# Patient Record
Sex: Male | Born: 1990 | Hispanic: Yes | Marital: Married | Smoking: Never smoker
Health system: Southern US, Community
[De-identification: ages and names within clinical notes are randomized; demographics above are authoritative.]

---

## 2015-02-27 ENCOUNTER — Encounter (HOSPITAL_COMMUNITY): Payer: Self-pay | Admitting: Emergency Medicine

## 2015-02-27 ENCOUNTER — Emergency Department (HOSPITAL_COMMUNITY)
Admission: EM | Admit: 2015-02-27 | Discharge: 2015-02-27 | Discharge status: Against Medical Advice | Payer: Self-pay | Attending: Emergency Medicine | Admitting: Emergency Medicine

## 2015-02-27 DIAGNOSIS — N644 Mastodynia: Secondary | ICD-10-CM

## 2015-02-27 DIAGNOSIS — N62 Hypertrophy of breast: Secondary | ICD-10-CM | POA: Insufficient documentation

## 2015-02-27 NOTE — ED Notes (Signed)
Pt states he is going to leave and "go to the clinic".

## 2015-02-27 NOTE — ED Provider Notes (Signed)
CSN: [id number]     Arrival date & time 02/27/15  1137 History  This chart was scribed for non-physician practitioner, Roxy Horseman, PA-C, working with Rolland Porter, MD by Charline Bills, ED Scribe. This patient was seen in room TR07C/TR07C and the patient's care was started at 12:17 PM.   Chief Complaint  Patient presents with  . Breast Problem  . Breast Pain   The history is provided by the patient. The history is limited by a language barrier. A language interpreter was used.   HPI Comments: Stuart Ortiz is a 24 y.o. male who presents to the Emergency Department with a chief complaint of persistent right nipple pain for the past few months, worsened over the past few days. Pt reports constant, moderate pain that is exacerbated with palpation. He further reports associated swelling to right nipple but denies drainage from the area. No known injury. He also denies fever and redness. No family h/o breast CA. No medical allergy.   History reviewed. No pertinent past medical history. No past surgical history on file. No family history on file. History  Substance Use Topics  . Smoking status: Never Smoker   . Smokeless tobacco: Not on file  . Alcohol Use: No    Review of Systems  Constitutional: Negative for fever.  Skin: Negative for color change.       + R breast mass   Allergies  Review of patient's allergies indicates not on file.  Home Medications   Prior to Admission medications   Not on File   BP 116/71 mmHg  Pulse 61  Temp(Src) 98.7 F (37.1 C) (Oral)  Resp 16  Ht 5\' 6"  (1.676 m)  Wt 156 lb (70.761 kg)  BMI 25.19 kg/m2  SpO2 100% Physical Exam  Constitutional: He is oriented to person, place, and time. He appears well-developed and well-nourished. No distress.  HENT:  Head: Normocephalic and atraumatic.  Eyes: Conjunctivae and EOM are normal.  Neck: Neck supple. No tracheal deviation present.  Cardiovascular: Normal rate.   Pulmonary/Chest: Effort normal.  No respiratory distress.  R sided gynecomastia. Tenderness to palpation. Tissue is soft. No nodules.  Musculoskeletal: Normal range of motion.  Neurological: He is alert and oriented to person, place, and time.  Skin: Skin is warm and dry. No erythema.  No cellulitis. No sign of abscess.   Psychiatric: He has a normal mood and affect. His behavior is normal.  Nursing note and vitals reviewed.  ED Course  Procedures (including critical care time) DIAGNOSTIC STUDIES: Oxygen Saturation is 100% on RA, normal by my interpretation.    COORDINATION OF CARE: 12:20 PM-Discussed treatment plan which includes CXR with pt at bedside and pt agreed to plan.   Labs Review Labs Reviewed - No data to display  Imaging Review No results found.   EKG Interpretation None      MDM   Final diagnoses:  Gynecomastia, male  Breast tenderness in male    Patient was promptly seen and evaluated with a Spanish language interpreter. Patient has been having swelling to his right breast and tenderness to his right nipple the past 1-2 months. Will check chest x-ray, and will recommend outpatient follow-up and ultrasound.  12:52 PM Informed that the patient left AMA prior to getting chest x-ray and pain medication. Patient stated that he will follow-up in the clinic. Patient signed out AGAINST MEDICAL ADVICE. Patient had capacity to make decisions for himself. He was urged by staff to stay and complete his workup.  I personally performed the services described in this documentation, which was scribed in my presence. The recorded information has been reviewed and is accurate.      Roxy Horseman, PA-C 02/27/15 1418  Rolland Porter, MD 02/28/15 1001

## 2015-02-27 NOTE — ED Notes (Signed)
Translator / wife stated, he's had right breast has been swelling for 5 weeks and painful

## 2015-02-27 NOTE — ED Notes (Signed)
Right nipple swollen and tender. No known injury. Onset 5 weeks ago. No drainage noted. States he "passed out twice yesterday due to pain".

## 2015-08-19 ENCOUNTER — Emergency Department (HOSPITAL_BASED_OUTPATIENT_CLINIC_OR_DEPARTMENT_OTHER)
Admission: EM | Admit: 2015-08-19 | Discharge: 2015-08-19 | Disposition: A | Discharge status: Home / Self Care | Payer: Self-pay | Attending: Emergency Medicine | Admitting: Emergency Medicine

## 2015-08-19 ENCOUNTER — Encounter (HOSPITAL_BASED_OUTPATIENT_CLINIC_OR_DEPARTMENT_OTHER): Payer: Self-pay

## 2015-08-19 DIAGNOSIS — J069 Acute upper respiratory infection, unspecified: Secondary | ICD-10-CM | POA: Insufficient documentation

## 2015-08-19 MED ORDER — BENZONATATE 100 MG PO CAPS
100.0000 mg | ORAL_CAPSULE | Freq: Once | ORAL | Status: AC
  Administered 2015-08-19: 100 mg via ORAL
  Filled 2015-08-19: qty 1

## 2015-08-19 MED ORDER — GUAIFENESIN ER 600 MG PO TB12: 600.0000 mg | ORAL_TABLET | Freq: Two times a day (BID) | ORAL | Status: AC

## 2015-08-19 MED ORDER — BENZONATATE 100 MG PO CAPS: 100.0000 mg | ORAL_CAPSULE | Freq: Three times a day (TID) | ORAL | Status: AC

## 2015-08-19 MED ORDER — IBUPROFEN 800 MG PO TABS: 800.0000 mg | ORAL_TABLET | Freq: Three times a day (TID) | ORAL | Status: AC

## 2015-08-19 MED ORDER — IBUPROFEN 800 MG PO TABS
800.0000 mg | ORAL_TABLET | Freq: Once | ORAL | Status: AC
  Administered 2015-08-19: 800 mg via ORAL
  Filled 2015-08-19: qty 1

## 2015-08-19 NOTE — ED Notes (Signed)
MD James at bedside.  

## 2015-08-19 NOTE — ED Notes (Signed)
C/o HA, cough, sore throat x 3 days-NAD-step daughter interpreter

## 2015-08-19 NOTE — Discharge Instructions (Signed)
Infección del tracto respiratorio superior, adultos  (Upper Respiratory Infection, Adult)  La mayoría de las infecciones del tracto respiratorio superior son infecciones virales de las vías que llevan el aire a los pulmones. Un infección del tracto respiratorio superior afecta la nariz, la garganta y las vías respiratorias superiores. El tipo más frecuente de infección del tracto respiratorio superior es la nasofaringitis, que habitualmente se conoce como "resfrío común".  Las infecciones del tracto respiratorio superior siguen su curso y por lo general se curan solas. En la mayoría de los casos, la infección del tracto respiratorio superior no requiere atención médica, pero a veces, después de una infección viral, puede surgir una infección bacteriana en las vías respiratorias superiores. Esto se conoce como infección secundaria. Las infecciones sinusales y en el oído medio son tipos frecuentes de infecciones secundarias en el tracto respiratorio superior.  La neumonía bacteriana también puede complicar un cuadro de infección del tracto respiratorio superior. Este tipo de infección puede empeorar el asma y la enfermedad pulmonar obstructiva crónica (EPOC). En algunos casos, estas complicaciones pueden requerir atención médica de emergencia y poner en peligro la vida.   CAUSAS  Casi todas las infecciones del tracto respiratorio superior se deben a los virus. Un virus es un tipo de microbio que puede contagiarse de una persona a otra.   FACTORES DE RIESGO  Puede estar en riesgo de sufrir una infección del tracto respiratorio superior si:   · Fuma.  · Tiene una enfermedad pulmonar o cardíaca crónica.  · Tiene debilitado el sistema de defensa (inmunitario) del cuerpo.  · Es muy joven o de edad muy avanzada.  · Tiene asma o alergias nasales.  · Trabaja en áreas donde hay mucha gente o poca ventilación.  · Trabaja en una escuela o en un centro de atención médica.  SIGNOS Y SÍNTOMAS   Habitualmente, los síntomas aparecen  de 2 a 3 días después de entrar en contacto con el virus del resfrío. La mayoría de las infecciones virales en el tracto respiratorio superior duran de 7 a 10 días. Sin embargo, las infecciones virales en el tracto respiratorio superior a causa del virus de la gripe pueden durar de 14 a 18 días y, habitualmente, son más graves. Entre los síntomas se pueden incluir los siguientes:   · Secreción o congestión nasal.  · Estornudos.  · Tos.  · Dolor de garganta.  · Dolor de cabeza.  · Fatiga.  · Fiebre.  · Pérdida del apetito.  · Dolor en la frente, detrás de los ojos y por encima de los pómulos (dolor sinusal).  · Dolores musculares.  DIAGNÓSTICO   El médico puede diagnosticar una infección del tracto respiratorio superior mediante los siguientes estudios:  · Examen físico.  · Pruebas para verificar si los síntomas no se deben a otra afección, por ejemplo:    Faringitis estreptocócica.    Sinusitis.    Neumonía.    Asma.  TRATAMIENTO   Esta infección desaparece sola, con el tiempo. No puede curarse con medicamentos, pero a menudo se prescriben para aliviar los síntomas. Los medicamentos pueden ser útiles para lo siguiente:  · Bajar la fiebre.  · Reducir la tos.  · Aliviar la congestión nasal.  INSTRUCCIONES PARA EL CUIDADO EN EL HOGAR   · Tome los medicamentos solamente como se lo haya indicado el médico.  · A fin de aliviar el dolor de garganta, haga gárgaras con solución salina templada o consuma caramelos para la tos, como se lo haya indicado el médico.  · Use un humidificador   de vapor cálido o inhale el vapor de la ducha para aumentar la humedad del aire. Esto facilitará la respiración.  · Beba suficiente líquido para mantener la orina clara o de color amarillo pálido.  · Consuma sopas y otros caldos transparentes, y aliméntese bien.  · Descanse todo lo que sea necesario.  · Regrese al trabajo cuando la temperatura se le haya normalizado o cuando el médico lo autorice. Es posible que deba quedarse en su casa durante  un tiempo prolongado, para no infectar a los demás. También puede usar un barbijo y lavarse las manos con cuidado para evitar la propagación del virus.  · Aumente el uso del inhalador si tiene asma.  · No consuma ningún producto que contenga tabaco, lo que incluye cigarrillos, tabaco de mascar o cigarrillos electrónicos. Si necesita ayuda para dejar de fumar, consulte al médico.  PREVENCIÓN   La mejor manera de protegerse de un resfrío es mantener una higiene adecuada.   · Evite el contacto oral o físico con personas que tengan síntomas de resfrío.  · En caso de contacto, lávese las manos con frecuencia.  No hay pruebas claras de que la vitamina C, la vitamina E, la equinácea o el ejercicio reduzcan la probabilidad de contraer un resfrío. Sin embargo, siempre se recomienda descansar mucho, hacer ejercicio y alimentarse bien.   SOLICITE ATENCIÓN MÉDICA SI:   · Su estado empeora en lugar de mejorar.  · Los medicamentos no logran controlar los síntomas.  · Tiene escalofríos.  · La sensación de falta de aire empeora.  · Tiene mucosidad marrón o roja.  · Tiene secreción nasal amarilla o marrón.  · Le duele la cara, especialmente al inclinarse hacia adelante.  · Tiene fiebre.  · Tiene los ganglios del cuello hinchados.  · Siente dolor al tragar.  · Tiene zonas blancas en la parte de atrás de la garganta.  SOLICITE ATENCIÓN MÉDICA DE INMEDIATO SI:   · Tiene síntomas intensos o persistentes de:    Dolor de cabeza.    Dolor de oídos.    Dolor sinusal.    Dolor en el pecho.  · Tiene enfermedad pulmonar crónica y cualquiera de estos síntomas:    Sibilancias.    Tos prolongada.    Tos con sangre.    Cambio en la mucosidad habitual.  · Presenta rigidez en el cuello.  · Tiene cambios en:    La visión.    La audición.    El pensamiento.    El estado de ánimo.  ASEGÚRESE DE QUE:   · Comprende estas instrucciones.  · Controlará su afección.  · Recibirá ayuda de inmediato si no mejora o si empeora.     Esta información no tiene como  fin reemplazar el consejo del médico. Asegúrese de hacerle al médico cualquier pregunta que tenga.     Document Released: 04/27/2005 Document Revised: 12/02/2014  Elsevier Interactive Patient Education ©2016 [employer]

## 2015-08-25 NOTE — ED Provider Notes (Signed)
CSN: [id number]     Arrival date & time 08/19/15  1137 History   First MD Initiated Contact with Patient 08/19/15 1242     Chief Complaint  Patient presents with  . URI      HPI  Patient presents for evaluation of a cough and runny noses bodyaches over last 2 days. No chest pain or shortness of breath no GI complaints. Is here with his wife with similar complaints. He was not denies for influenza. No skin rash noted joint pain or other complaints.  History reviewed. No pertinent past medical history. History reviewed. No pertinent past surgical history. No family history on file. Social History  Substance Use Topics  . Smoking status: Never Smoker   . Smokeless tobacco: None  . Alcohol Use: No    Review of Systems  Constitutional: Negative for fever, chills, diaphoresis, appetite change and fatigue.  HENT: Positive for congestion. Negative for mouth sores, sore throat and trouble swallowing.   Eyes: Negative for visual disturbance.  Respiratory: Positive for cough. Negative for chest tightness, shortness of breath and wheezing.   Cardiovascular: Negative for chest pain.  Gastrointestinal: Negative for nausea, vomiting, abdominal pain, diarrhea and abdominal distention.  Endocrine: Negative for polydipsia, polyphagia and polyuria.  Genitourinary: Negative for dysuria, frequency and hematuria.  Musculoskeletal: Negative for gait problem.  Skin: Negative for color change, pallor and rash.  Neurological: Negative for dizziness, syncope, light-headedness and headaches.  Hematological: Does not bruise/bleed easily.  Psychiatric/Behavioral: Negative for behavioral problems and confusion.      Allergies  Review of patient's allergies indicates no known allergies.  Home Medications   Prior to Admission medications   Medication Sig Start Date End Date Taking? Authorizing Provider  benzonatate (TESSALON) 100 MG capsule Take 1 capsule (100 mg total) by mouth every 8 (eight) hours.  08/19/15   Rolland Porter, MD  guaiFENesin (MUCINEX) 600 MG 12 hr tablet Take 1 tablet (600 mg total) by mouth 2 (two) times daily. 08/19/15   Rolland Porter, MD  ibuprofen (ADVIL,MOTRIN) 800 MG tablet Take 1 tablet (800 mg total) by mouth 3 (three) times daily. 08/19/15   Rolland Porter, MD   BP 124/80 mmHg  Pulse 70  Temp(Src) 98.2 F (36.8 C) (Oral)  Resp 16  SpO2 100% Physical Exam  Constitutional: He is oriented to person, place, and time. He appears well-developed and well-nourished. No distress.  HENT:  Head: Normocephalic.  Eyes: Conjunctivae are normal. Pupils are equal, round, and reactive to light. No scleral icterus.  Neck: Normal range of motion. Neck supple. No thyromegaly present.  Cardiovascular: Normal rate and regular rhythm.  Exam reveals no gallop and no friction rub.   No murmur heard. Pulmonary/Chest: Effort normal and breath sounds normal. No respiratory distress. He has no wheezes. He has no rales.  Abdominal: Soft. Bowel sounds are normal. He exhibits no distension. There is no tenderness. There is no rebound.  Musculoskeletal: Normal range of motion.  Neurological: He is alert and oriented to person, place, and time.  Skin: Skin is warm and dry. No rash noted.  Psychiatric: He has a normal mood and affect. His behavior is normal.    ED Course  Procedures (including critical care time) Labs Review Labs Reviewed - No data to display  Imaging Review No results found. I have personally reviewed and evaluated these images and lab results as part of my medical decision-making.   EKG Interpretation None      MDM   Final diagnoses:  URI (upper respiratory infection)    No specific or localizing symptoms or findings to suggest suffered for bacterial infection. Likely simple viral syndrome. Plan is expectant management symptomatically treatment.    Rolland Porter, MD 08/25/15 [phone]

## 2015-10-27 ENCOUNTER — Emergency Department (HOSPITAL_BASED_OUTPATIENT_CLINIC_OR_DEPARTMENT_OTHER)
Admission: EM | Admit: 2015-10-27 | Discharge: 2015-10-27 | Disposition: A | Discharge status: Home / Self Care | Payer: Self-pay | Attending: Emergency Medicine | Admitting: Emergency Medicine

## 2015-10-27 ENCOUNTER — Encounter (HOSPITAL_BASED_OUTPATIENT_CLINIC_OR_DEPARTMENT_OTHER): Payer: Self-pay | Admitting: *Deleted

## 2015-10-27 DIAGNOSIS — Z4802 Encounter for removal of sutures: Secondary | ICD-10-CM | POA: Insufficient documentation

## 2015-10-27 DIAGNOSIS — Z791 Long term (current) use of non-steroidal anti-inflammatories (NSAID): Secondary | ICD-10-CM | POA: Insufficient documentation

## 2015-10-27 DIAGNOSIS — Z79899 Other long term (current) drug therapy: Secondary | ICD-10-CM | POA: Insufficient documentation

## 2015-10-27 NOTE — Discharge Instructions (Signed)
Remoción de la sutura, cuidados posteriores  (Suture Removal, Care After)  Siga estas instrucciones durante las próximas semanas. Estas indicaciones le proporcionan información general acerca de cómo deberá cuidarse después del procedimiento. El médico también podrá darle instrucciones más específicas. El tratamiento se ha planificado de acuerdo a las prácticas médicas actuales, pero a veces se producen problemas. Comuníquese con el médico si tiene algún problema o tiene dudas después del procedimiento.  QUÉ ESPERAR DESPUÉS DEL PROCEDIMIENTO  Después de que le retiren los puntos (suturas), es normal experimentar lo siguiente:  · Molestias e hinchazón en la zona de la herida.  · Leve enrojecimiento en la zona de la herida.  INSTRUCCIONES PARA EL CUIDADO EN EL HOGAR   · Si tiene bandas adhesivas en la piel sobre la zona de la herida, no las retire. Se caerán solas en unos pocos días. Si las bandas adhesivas siguen en su lugar después de 14 días, entonces puede retirarlas.  · Cambie los apósitos (vendajes), al menos, una vez al día o según las instrucciones del médico. Si el vendaje se adhiere, remójelo con agua jabonosa tibia.  · Solo aplique un ungüento o crema según las indicaciones del médico. Si usa crema o ungüento, lave la zona con agua y jabón dos veces por día para quitarlo todo. Enjuague el jabón y seque suavemente la zona con una toalla limpia.  · Mantenga la herida limpia y seca. Si el vendaje se moja, se ensucia o tiene mal olor, cámbielo tan pronto como pueda.  · Continúe protegiendo la herida de lesiones.  · Use protector solar cuando salga al sol. Las cicatrices nuevas se broncean fácilmente.  SOLICITE ATENCIÓN MÉDICA SI:  · Aumenta el enrojecimiento, la hinchazón o el dolor en la zona afectada.  · Observa que sale pus de la herida.  · Tiene fiebre.  · Advierte un olor fétido que proviene de la herida o del vendaje.  · La herida se abre (los bordes se separan).     Esta información no tiene como fin  reemplazar el consejo del médico. Asegúrese de hacerle al médico cualquier pregunta que tenga.     Document Released: 04/27/2005 Document Revised: 05/08/2013  Elsevier Interactive Patient Education ©2016 [employer] Inc[employer]

## 2015-10-27 NOTE — ED Notes (Signed)
Suture removal kit placed at bedside.
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
## 2015-10-27 NOTE — ED Notes (Signed)
Suture removal from scalp.

## 2016-01-10 ENCOUNTER — Encounter (HOSPITAL_COMMUNITY): Payer: Self-pay | Admitting: Family Medicine

## 2016-01-10 ENCOUNTER — Emergency Department (HOSPITAL_COMMUNITY): Payer: Self-pay

## 2016-01-10 ENCOUNTER — Emergency Department (HOSPITAL_COMMUNITY)
Admission: EM | Admit: 2016-01-10 | Discharge: 2016-01-10 | Disposition: A | Discharge status: Home / Self Care | Payer: Self-pay | Attending: Emergency Medicine | Admitting: Emergency Medicine

## 2016-01-10 DIAGNOSIS — M79601 Pain in right arm: Secondary | ICD-10-CM | POA: Insufficient documentation

## 2016-01-10 DIAGNOSIS — Z791 Long term (current) use of non-steroidal anti-inflammatories (NSAID): Secondary | ICD-10-CM | POA: Insufficient documentation

## 2016-01-10 DIAGNOSIS — R51 Headache: Secondary | ICD-10-CM | POA: Insufficient documentation

## 2016-01-10 DIAGNOSIS — N62 Hypertrophy of breast: Secondary | ICD-10-CM | POA: Insufficient documentation

## 2016-01-10 LAB — BASIC METABOLIC PANEL
ANION GAP: 10 (ref 5–15)
BUN: 9 mg/dL (ref 6–20)
CHLORIDE: 105 mmol/L (ref 101–111)
CO2: 22 mmol/L (ref 22–32)
Calcium: 9.6 mg/dL (ref 8.9–10.3)
Creatinine, Ser: 0.89 mg/dL (ref 0.61–1.24)
GFR calc non Af Amer: >60 mL/min (ref >60)
GLUCOSE: 113 mg/dL — AB (ref 65–99)
POTASSIUM: 3.8 mmol/L (ref 3.5–5.1)
Sodium: 137 mmol/L (ref 135–145)

## 2016-01-10 LAB — CBC WITH DIFFERENTIAL/PLATELET
BASOS ABS: 0 10*3/uL (ref 0.0–0.1)
BASOS PCT: 0 %
Eosinophils Absolute: 1.6 10*3/uL — ABNORMAL HIGH (ref 0.0–0.7)
Eosinophils Relative: 22 %
HEMATOCRIT: 44.9 % (ref 39.0–52.0)
HEMOGLOBIN: 15.1 g/dL (ref 13.0–17.0)
LYMPHS PCT: 24 %
Lymphs Abs: 1.7 10*3/uL (ref 0.7–4.0)
MCH: 28.3 pg (ref 26.0–34.0)
MCHC: 33.6 g/dL (ref 30.0–36.0)
MCV: 84.2 fL (ref 78.0–100.0)
MONO ABS: 0.4 10*3/uL (ref 0.1–1.0)
Monocytes Relative: 6 %
NEUTROS ABS: 3.3 10*3/uL (ref 1.7–7.7)
NEUTROS PCT: 48 %
Platelets: 208 10*3/uL (ref 150–400)
RBC: 5.33 MIL/uL (ref 4.22–5.81)
RDW: 13.1 % (ref 11.5–15.5)
WBC: 7 10*3/uL (ref 4.0–10.5)

## 2016-01-10 LAB — I-STAT TROPONIN, ED: Troponin i, poc: 0 ng/mL (ref 0.00–0.08)

## 2016-01-10 MED ORDER — SODIUM CHLORIDE 0.9 % IV BOLUS (SEPSIS)
1000.0000 mL | Freq: Once | INTRAVENOUS | Status: AC
  Administered 2016-01-10: 1000 mL via INTRAVENOUS

## 2016-01-10 MED ORDER — MORPHINE SULFATE (PF) 4 MG/ML IV SOLN
4.0000 mg | INTRAVENOUS | Status: DC | PRN
  Administered 2016-01-10 (×2): 4 mg via INTRAVENOUS
  Filled 2016-01-10 (×2): qty 1

## 2016-01-10 MED ORDER — IOPAMIDOL (ISOVUE-370) INJECTION 76%
INTRAVENOUS | Status: AC
  Administered 2016-01-10: 100 mL
  Filled 2016-01-10: qty 100

## 2016-01-10 MED ORDER — NAPROXEN 500 MG PO TABS: 500.0000 mg | ORAL_TABLET | Freq: Two times a day (BID) | ORAL | Status: AC

## 2016-01-10 NOTE — ED Notes (Signed)
Patient transported to CT 

## 2016-01-10 NOTE — ED Notes (Signed)
Pt here for pain in right side of head radiating into right neck, shoulder and arm. sts he woke up at 5 am this way. sts he feels dizzy.

## 2016-01-10 NOTE — Discharge Instructions (Signed)
Follow up with a primary care doctor for further evaluation.  Take the medications as prescribed.

## 2016-01-10 NOTE — ED Notes (Signed)
Patient transported to X-ray 

## 2016-01-10 NOTE — ED Provider Notes (Addendum)
CSN: [id number]     Arrival date & time 01/10/16  1059 History   First MD Initiated Contact with Patient 01/10/16 1124     Chief Complaint  Patient presents with  . Arm Pain  . Chest Pain  . Headache   HPI Pt woke up this am complaining of pain on the right side of his chest.  The right breast area feels swollen and enlarged.  He has pain also hin his head and his right arm and neck.  The pain is sharp.  This all started around 5 am.  He also feels dizzy and lightheaded.  He is concerned he may die.  No fevers.  No shortness of breath.  No cough.  He denies prior breast swelling.  History reviewed. No pertinent past medical history. History reviewed. No pertinent past surgical history. History reviewed. No pertinent family history. Social History  Substance Use Topics  . Smoking status: Never Smoker   . Smokeless tobacco: None  . Alcohol Use: No    Review of Systems  All other systems reviewed and are negative.     Allergies  Review of patient's allergies indicates no known allergies.  Home Medications   Prior to Admission medications   Medication Sig Start Date End Date Taking? Authorizing Provider  benzonatate (TESSALON) 100 MG capsule Take 1 capsule (100 mg total) by mouth every 8 (eight) hours. 08/19/15   Rolland PorterMark James, MD  guaiFENesin (MUCINEX) 600 MG 12 hr tablet Take 1 tablet (600 mg total) by mouth 2 (two) times daily. 08/19/15   Rolland PorterMark James, MD  ibuprofen (ADVIL,MOTRIN) 800 MG tablet Take 1 tablet (800 mg total) by mouth 3 (three) times daily. 08/19/15   Rolland PorterMark James, MD  naproxen (NAPROSYN) 500 MG tablet Take 1 tablet (500 mg total) by mouth 2 (two) times daily. 01/10/16   Linwood DibblesJon Arieanna Pressey, MD   BP 124/76 mmHg  Pulse 73  Temp(Src) 98.2 F (36.8 C)  Resp 18  SpO2 96% Physical Exam  Constitutional: He appears well-developed and well-nourished. No distress.  HENT:  Head: Normocephalic and atraumatic.  Right Ear: External ear normal.  Left Ear: External ear normal.  Eyes:  Conjunctivae are normal. Right eye exhibits no discharge. Left eye exhibits no discharge. No scleral icterus.  Neck: Neck supple. No tracheal deviation present.  Cardiovascular: Normal rate, regular rhythm and intact distal pulses.   Pulmonary/Chest: Effort normal and breath sounds normal. No stridor. No respiratory distress. He has no wheezes. He has no rales.  ?gynecomastia on right side, no induration, no erythema but right pectoral region is greater than the left  Abdominal: Soft. Bowel sounds are normal. He exhibits no distension. There is no tenderness. There is no rebound and no guarding.  Musculoskeletal: He exhibits no edema or tenderness.  Neurological: He is alert. He has normal strength. No cranial nerve deficit (no facial droop, extraocular movements intact, no slurred speech) or sensory deficit. He exhibits normal muscle tone. He displays no seizure activity. Coordination normal.  Skin: Skin is warm and dry. No rash noted.  Psychiatric: He has a normal mood and affect.  Nursing note and vitals reviewed.   ED Course  Procedures (including critical care time) Labs Review Labs Reviewed  CBC WITH DIFFERENTIAL/PLATELET - Abnormal; Notable for the following:    Eosinophils Absolute 1.6 (*)    All other components within normal limits  BASIC METABOLIC PANEL - Abnormal; Notable for the following:    Glucose, Bld 113 (*)    All other  components within normal limits  Rosezena Sensor, ED    Imaging Review Dg Chest 2 View  01/10/2016  CLINICAL DATA:  Chest pain EXAM: CHEST  2 VIEW COMPARISON:  None. FINDINGS: The heart size and mediastinal contours are within normal limits. Both lungs are clear. The visualized skeletal structures are unremarkable. IMPRESSION: No active cardiopulmonary disease. Electronically Signed   By: Gerome Sam III M.D   On: 01/10/2016 12:12   Ct Angio Chest Pe W/cm &/or Wo Cm  01/10/2016  CLINICAL DATA:  Right-sided chest pain EXAM: CT ANGIOGRAPHY CHEST  WITH CONTRAST TECHNIQUE: Multidetector CT imaging of the chest was performed using the standard protocol during bolus administration of intravenous contrast. Multiplanar CT image reconstructions and MIPs were obtained to evaluate the vascular anatomy. CONTRAST:  100 mL of Isovue 370 COMPARISON:  None. FINDINGS: Central airways are normal. No pneumothorax. No pulmonary nodules, masses, or focal infiltrates. There is gynecomastia on the right. No adenopathy. No effusion. The heart size is normal. No aneurysm or dissection seen on limited views of the thoracic aorta. No pulmonary emboli. Evaluation of the upper abdomen is limited but unremarkable. Visualized bones are normal. Review of the MIP images confirms the above findings. IMPRESSION: No pulmonary emboli or acute abnormalities. Electronically Signed   By: Gerome Sam III M.D   On: 01/10/2016 14:09   I have personally reviewed and evaluated these images and lab results as part of my medical decision-making.   EKG Interpretation   Date/Time:  Sunday January 10 2016 14:02:41 EDT Ventricular Rate:  56 PR Interval:  124 QRS Duration: 110 QT Interval:  417 QTC Calculation: 402 R Axis:   33 Text Interpretation:  Sinus rhythm Normal ECG No old tracing to compare  Confirmed by Alyx Mcguirk  MD-J, Raeanna Soberanes ([id number]) on 01/10/2016 2:06:33 PM      MDM   Final diagnoses:  Gynecomastia, male   Labs are normal.  CT scan shows unilateral gynecomastia which is consistent with his exam.  No erythema of the skin to suggest cellulitis.  No discrete mass or abscess on exam.  Will have him take a course of nsaids.  Follow up with a primary are doctor (referral to [facility]) to see if it improves or consider further evaluation if it persists.      Linwood Dibbles, MD 01/10/16 55  I reviewed old records and patient was seen in July 2016 and was diagnosed with gynecomastia. Could consider outpatient surgical treatment.  No emergency condition present at this time.    Linwood Dibbles, MD 01/10/16 1430

## 2017-04-11 IMAGING — DX DG CHEST 2V
2 series · 2 of 2 positions shown · non-contrast
Comparison: None.

CLINICAL DATA: Chest pain

EXAM:
CHEST  2 VIEW

[w chest pa]
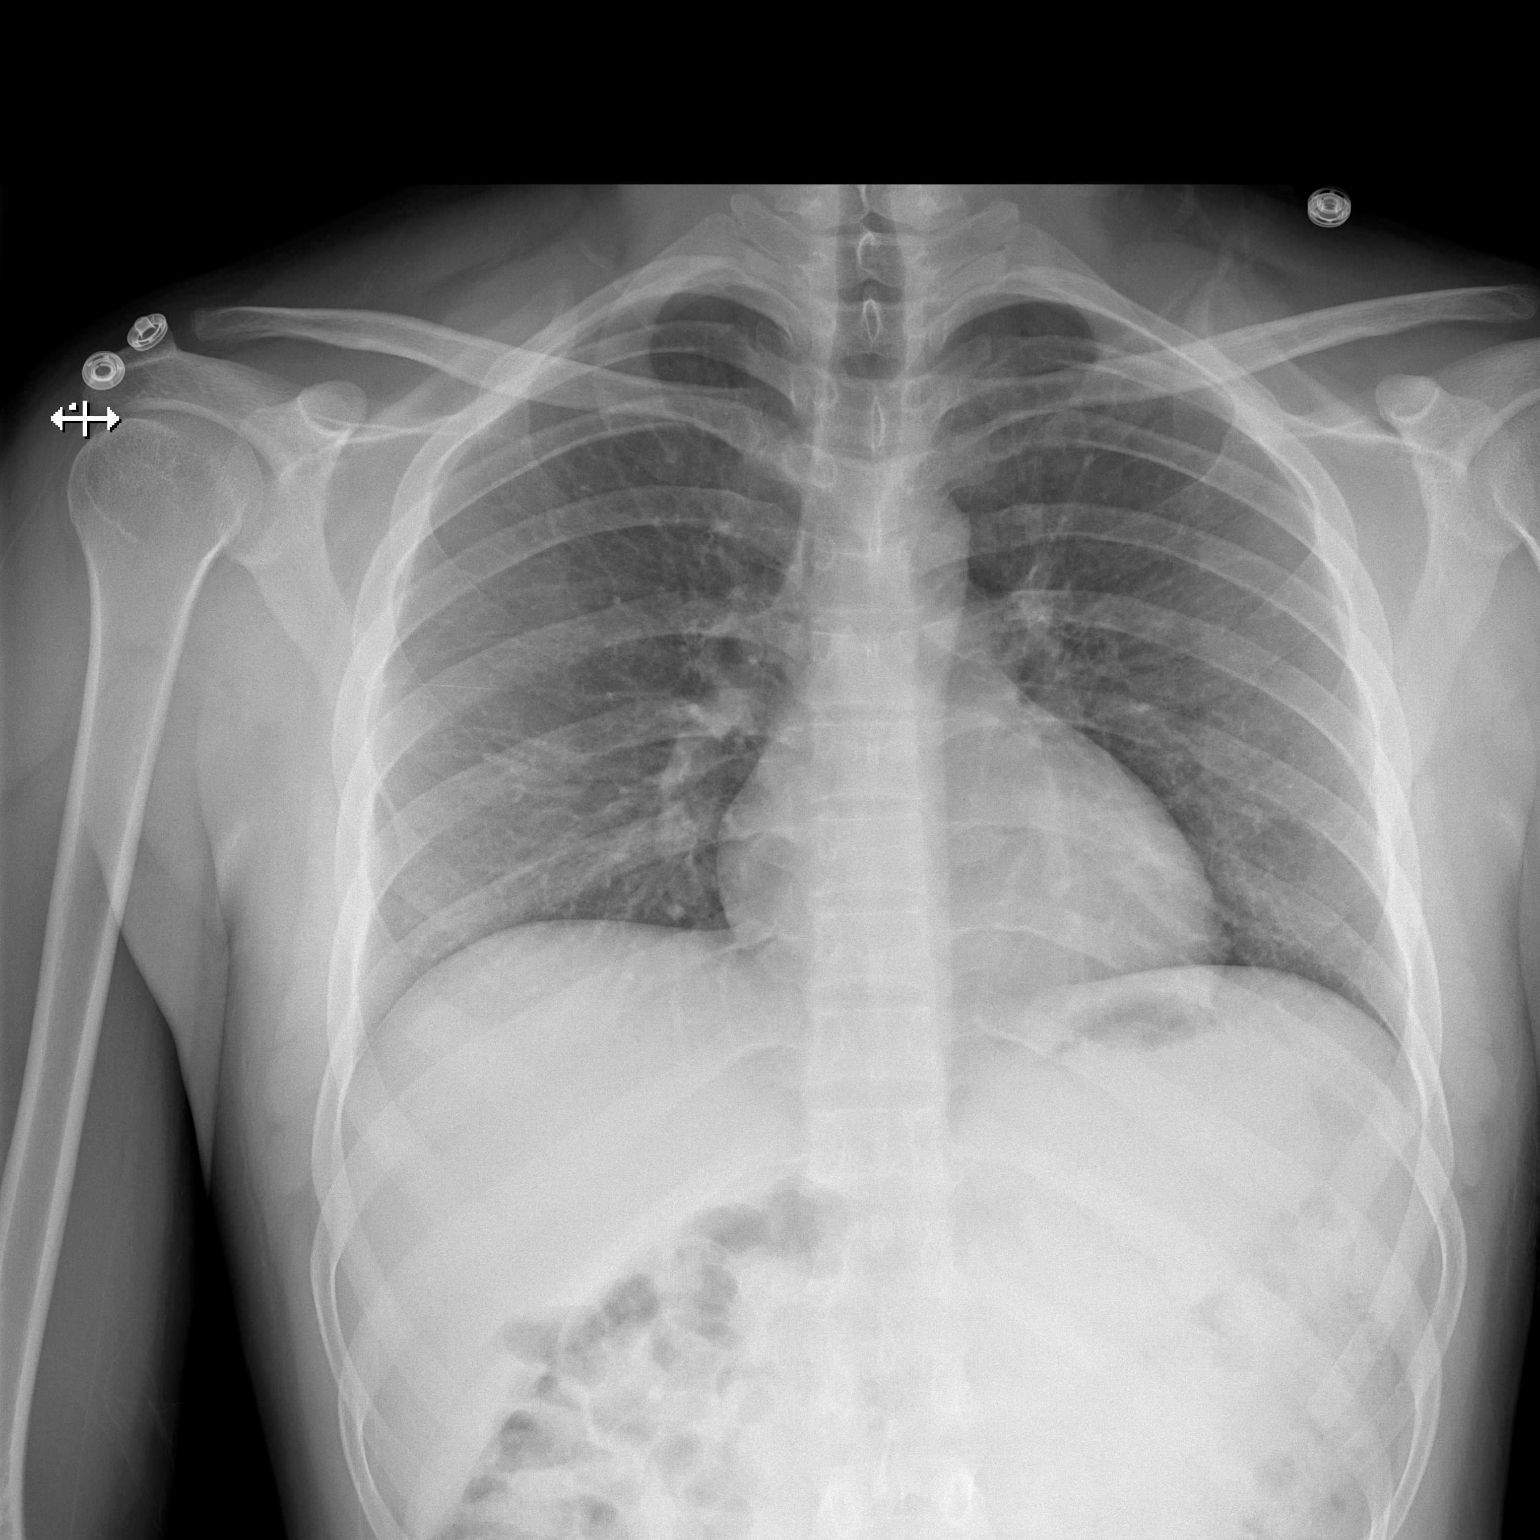

[w chest lat]
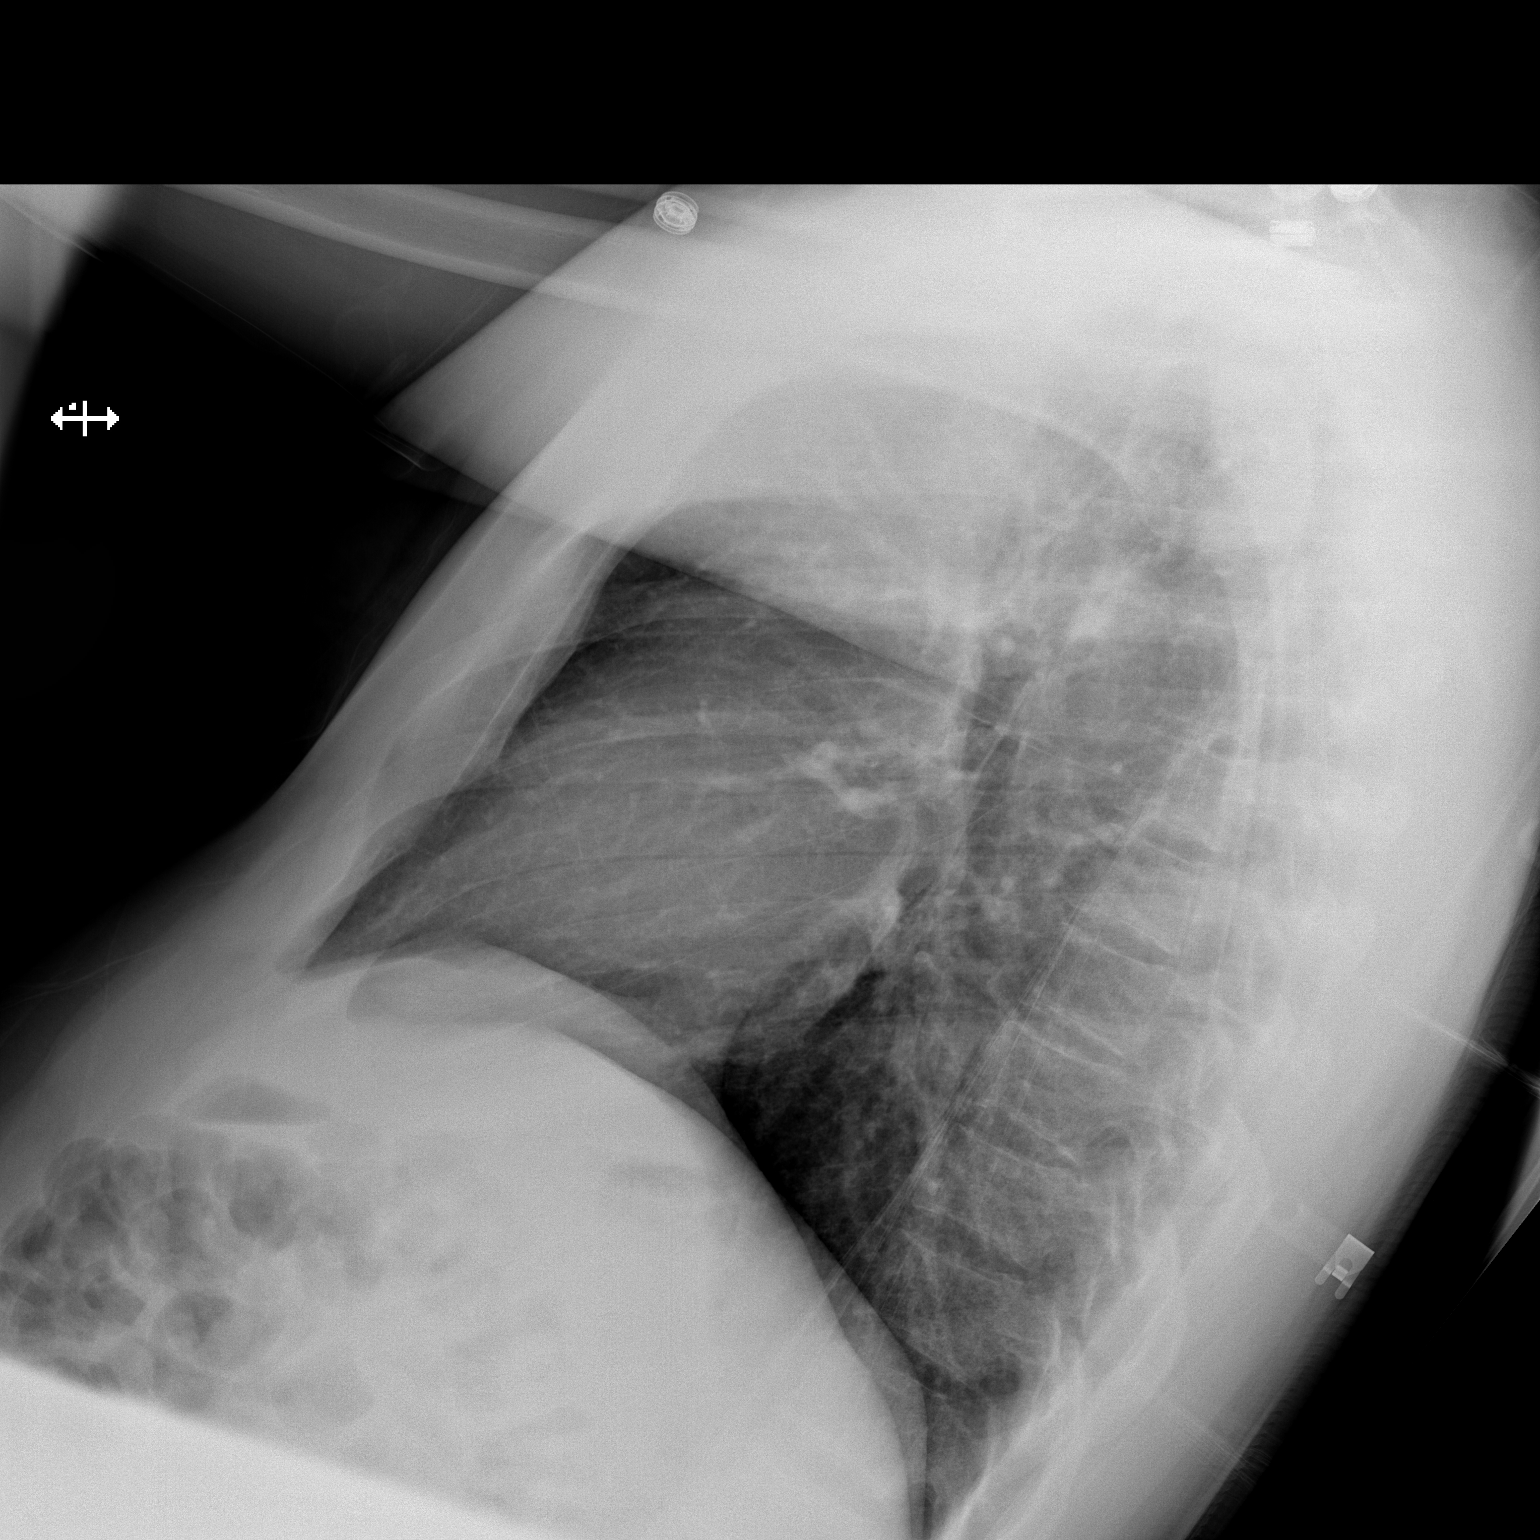

[2 of 2 positions shown; findings below may reference images not displayed]

FINDINGS: The heart size and mediastinal contours are within normal limits.
Both lungs are clear. The visualized skeletal structures are
unremarkable.
IMPRESSION: No active cardiopulmonary disease.

## 2017-04-11 IMAGING — CT CT ANGIO CHEST
1 of 8 series · 17 of 36 positions shown · IV contrast (Iohexol (Omnipaque 350))
Comparison: None.

CLINICAL DATA: Right-sided chest pain

EXAM:
CT ANGIOGRAPHY CHEST WITH CONTRAST
TECHNIQUE: Multidetector CT imaging of the chest was performed using the
standard protocol during bolus administration of intravenous
contrast. Multiplanar CT image reconstructions and MIPs were
obtained to evaluate the vascular anatomy.
CONTRAST:  100 mL of Isovue 370

[Series 406: thins pacs · axial · 0.75mm/px · z∈[+43,+278]mm · 17 of 265 slices shown]
[im 15/265  lung]
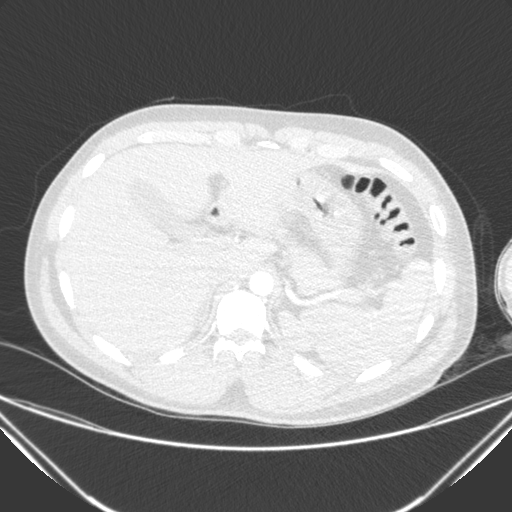
[im 30/265  mediastinal]
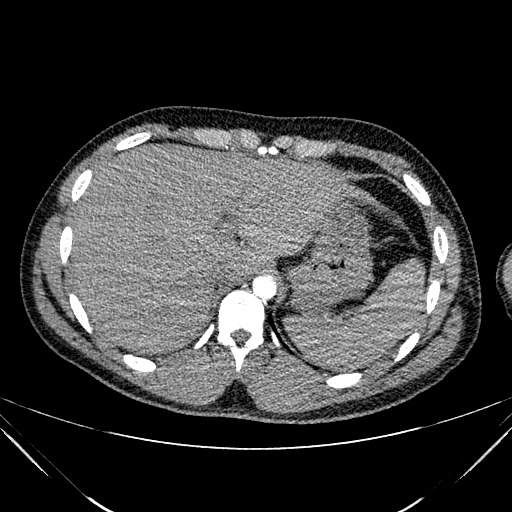
[im 45/265  lung]
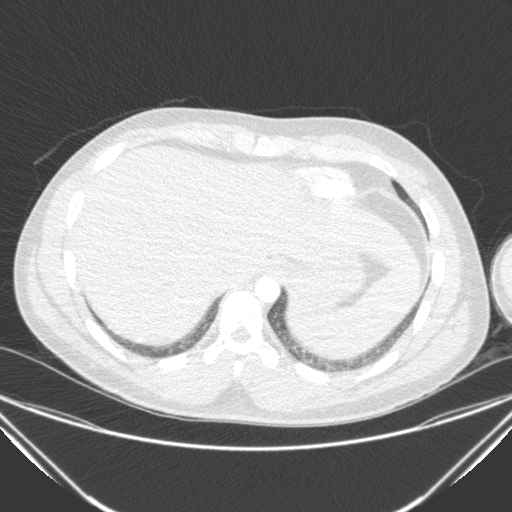
[im 59/265  mediastinal]
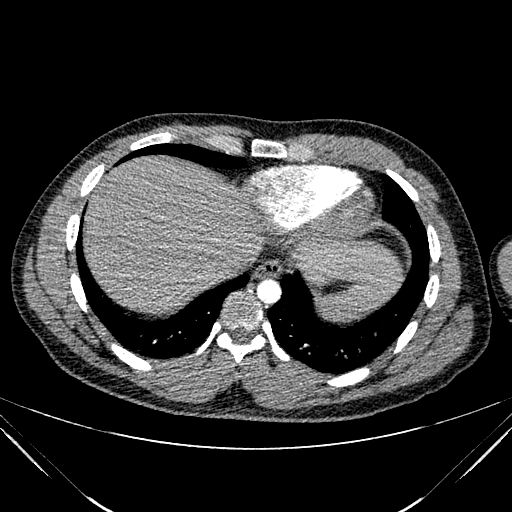
[im 74/265  lung]
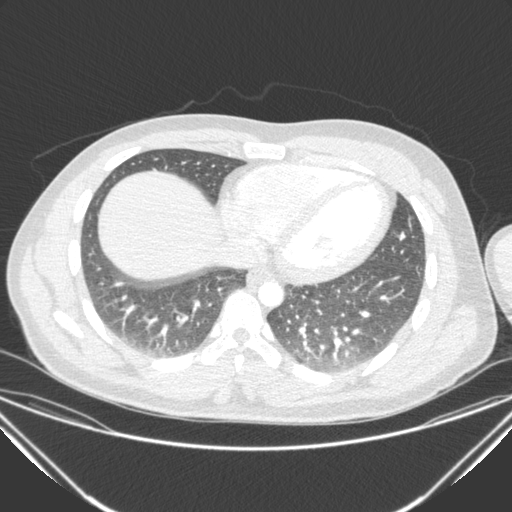
[im 89/265  mediastinal]
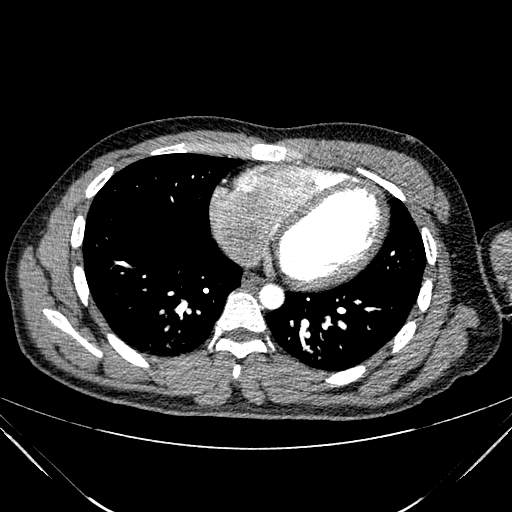
[im 103/265  lung]
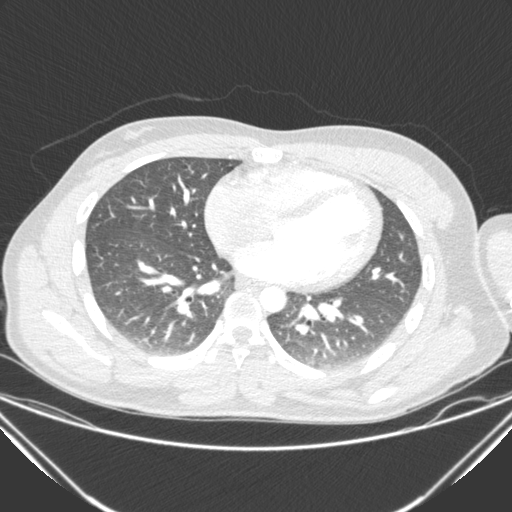
[im 118/265  mediastinal]
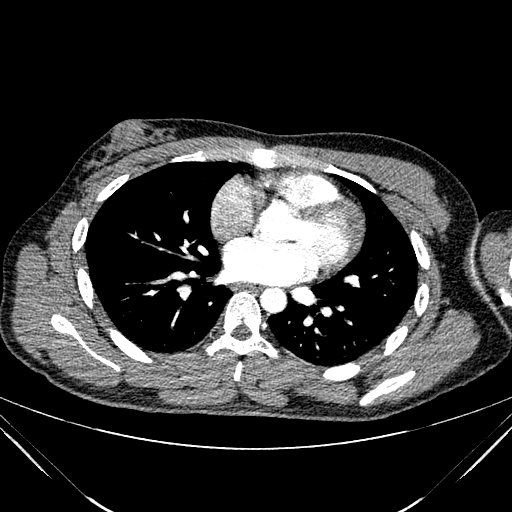
[im 133/265  lung]
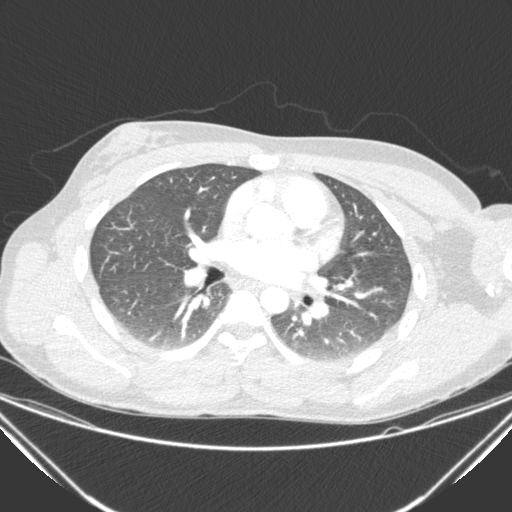
[im 147/265  mediastinal]
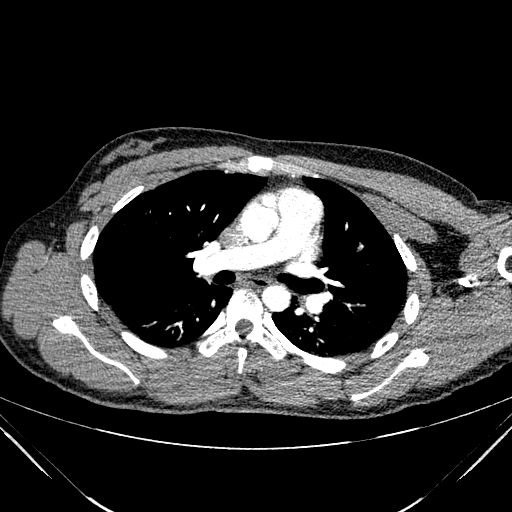
[im 162/265  lung]
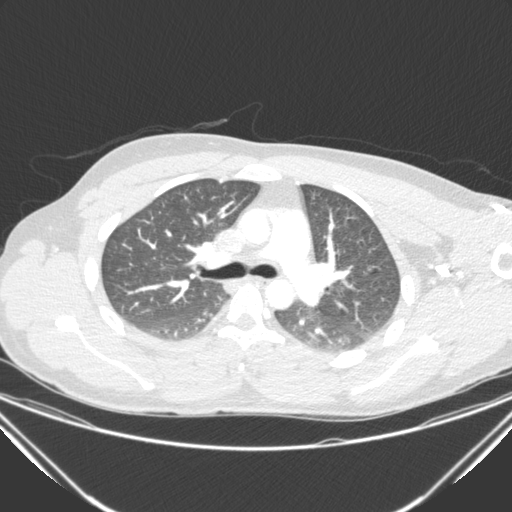
[im 177/265  mediastinal]
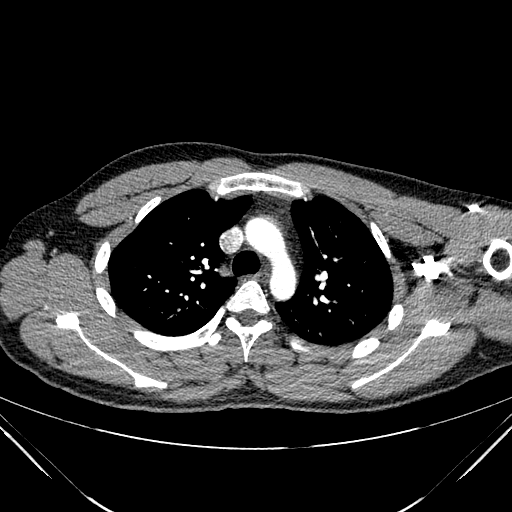
[im 191/265  lung]
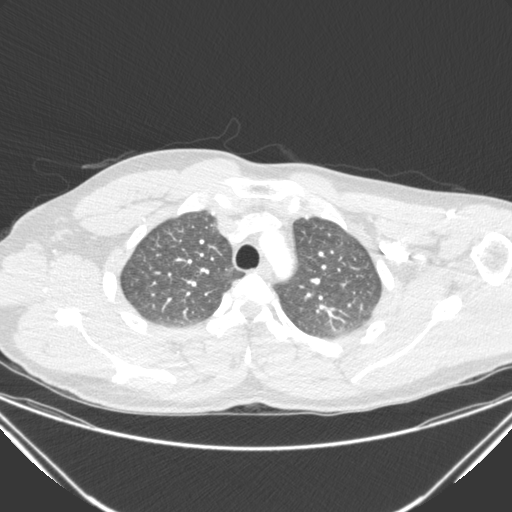
[im 206/265  mediastinal]
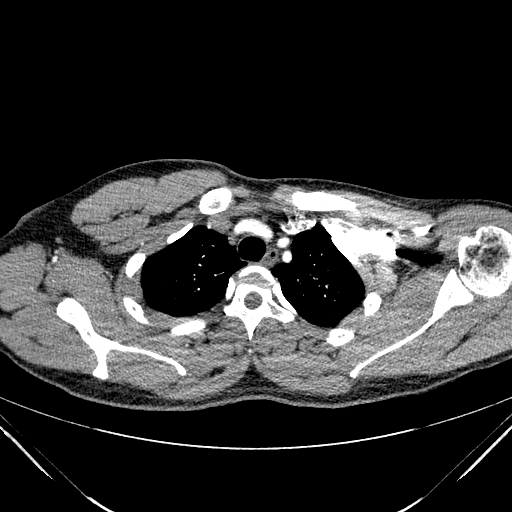
[im 221/265  lung]
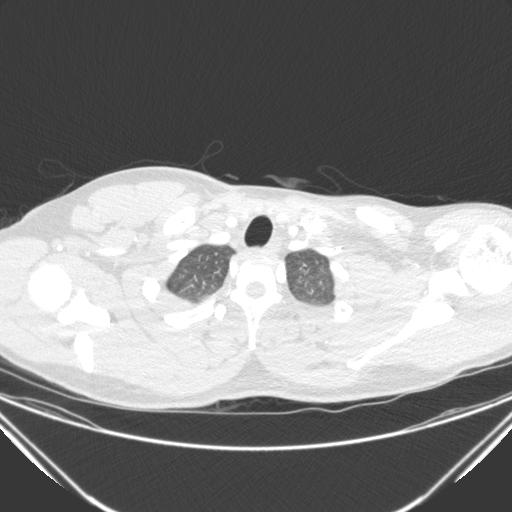
[im 235/265  mediastinal]
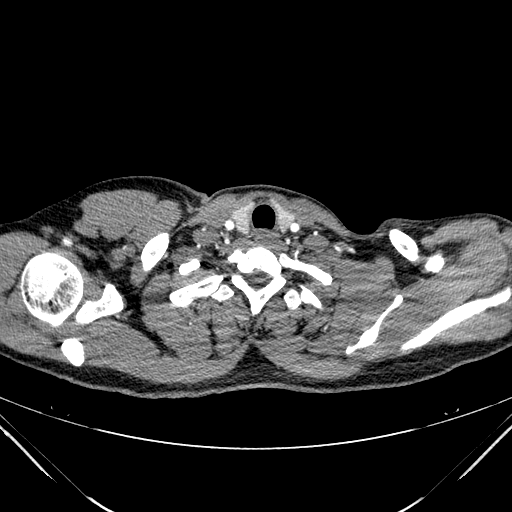
[im 250/265  lung]
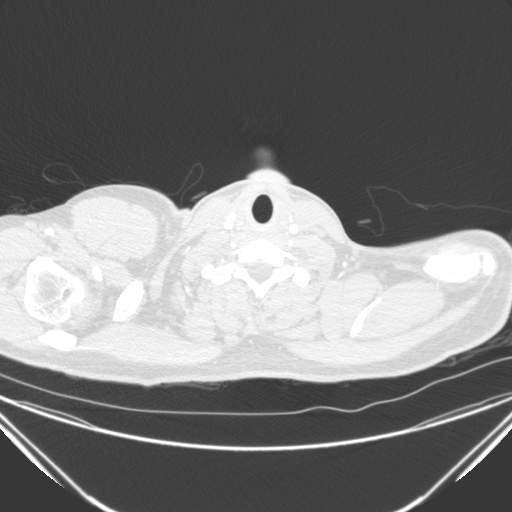

[17 of 36 positions shown; findings below may reference images not displayed]

FINDINGS: Central airways are normal. No pneumothorax. No pulmonary nodules,
masses, or focal infiltrates. There is gynecomastia on the right. No
adenopathy. No effusion. The heart size is normal. No aneurysm or
dissection seen on limited views of the thoracic aorta. No pulmonary
emboli.

Evaluation of the upper abdomen is limited but unremarkable.

Visualized bones are normal.

Review of the MIP images confirms the above findings.
IMPRESSION: No pulmonary emboli or acute abnormalities.
